# Patient Record
Sex: Female | Born: 1980 | Hispanic: Yes | Marital: Single | Smoking: Current every day smoker
Health system: Southern US, Community
[De-identification: ages and names within clinical notes are randomized; demographics above are authoritative.]

## PROBLEM LIST (undated history)

## (undated) DIAGNOSIS — F419 Anxiety disorder, unspecified: Secondary | ICD-10-CM

---

## 2016-03-20 ENCOUNTER — Emergency Department (HOSPITAL_BASED_OUTPATIENT_CLINIC_OR_DEPARTMENT_OTHER)
Admission: EM | Admit: 2016-03-20 | Discharge: 2016-03-20 | Disposition: A | Discharge status: Home / Self Care | Payer: BLUE CROSS/BLUE SHIELD | Attending: Emergency Medicine | Admitting: Emergency Medicine

## 2016-03-20 ENCOUNTER — Encounter (HOSPITAL_BASED_OUTPATIENT_CLINIC_OR_DEPARTMENT_OTHER): Payer: Self-pay

## 2016-03-20 ENCOUNTER — Emergency Department (HOSPITAL_BASED_OUTPATIENT_CLINIC_OR_DEPARTMENT_OTHER): Payer: BLUE CROSS/BLUE SHIELD

## 2016-03-20 DIAGNOSIS — R072 Precordial pain: Secondary | ICD-10-CM | POA: Diagnosis present

## 2016-03-20 DIAGNOSIS — K219 Gastro-esophageal reflux disease without esophagitis: Secondary | ICD-10-CM | POA: Insufficient documentation

## 2016-03-20 DIAGNOSIS — F17200 Nicotine dependence, unspecified, uncomplicated: Secondary | ICD-10-CM | POA: Insufficient documentation

## 2016-03-20 DIAGNOSIS — R079 Chest pain, unspecified: Secondary | ICD-10-CM

## 2016-03-20 LAB — CBC WITH DIFFERENTIAL/PLATELET
Basophils Absolute: 0 10*3/uL (ref 0.0–0.1)
Basophils Relative: 0 %
EOS PCT: 2 %
Eosinophils Absolute: 0.1 10*3/uL (ref 0.0–0.7)
HCT: 37 % (ref 36.0–46.0)
Hemoglobin: 12.3 g/dL (ref 12.0–15.0)
LYMPHS ABS: 1 10*3/uL (ref 0.7–4.0)
LYMPHS PCT: 16 %
MCH: 28 pg (ref 26.0–34.0)
MCHC: 33.2 g/dL (ref 30.0–36.0)
MCV: 84.1 fL (ref 78.0–100.0)
MONO ABS: 0.6 10*3/uL (ref 0.1–1.0)
MONOS PCT: 9 %
Neutro Abs: 4.7 10*3/uL (ref 1.7–7.7)
Neutrophils Relative %: 73 %
PLATELETS: 229 10*3/uL (ref 150–400)
RBC: 4.4 MIL/uL (ref 3.87–5.11)
RDW: 14.1 % (ref 11.5–15.5)
WBC: 6.3 10*3/uL (ref 4.0–10.5)

## 2016-03-20 LAB — BASIC METABOLIC PANEL
Anion gap: 6 (ref 5–15)
BUN: 10 mg/dL (ref 6–20)
CHLORIDE: 107 mmol/L (ref 101–111)
CO2: 24 mmol/L (ref 22–32)
CREATININE: 0.54 mg/dL (ref 0.44–1.00)
Calcium: 9 mg/dL (ref 8.9–10.3)
GFR calc Af Amer: >60 mL/min (ref >60)
GFR calc non Af Amer: >60 mL/min (ref >60)
GLUCOSE: 98 mg/dL (ref 65–99)
POTASSIUM: 4.2 mmol/L (ref 3.5–5.1)
SODIUM: 137 mmol/L (ref 135–145)

## 2016-03-20 LAB — TROPONIN I

## 2016-03-20 MED ORDER — GI COCKTAIL ~~LOC~~
30.0000 mL | Freq: Once | ORAL | Status: AC
  Administered 2016-03-20: 30 mL via ORAL
  Filled 2016-03-20: qty 30

## 2016-03-20 MED ORDER — OMEPRAZOLE 20 MG PO CPDR: 20.0000 mg | DELAYED_RELEASE_CAPSULE | Freq: Every day | ORAL | Status: AC

## 2016-03-20 MED FILL — OMEPRAZOLE DR 20 MG CAPSULE: 20 | 30 days supply | Qty: 30 | Fill #0

## 2016-03-20 NOTE — Discharge Instructions (Signed)
Gastroesophageal Reflux Disease, Adult °Normally, food travels down the esophagus and stays in the stomach to be digested. However, when a person has gastroesophageal reflux disease (GERD), food and stomach acid move back up into the esophagus. When this happens, the esophagus becomes sore and inflamed. Over time, GERD can create small holes (ulcers) in the lining of the esophagus.  °CAUSES °This condition is caused by a problem with the muscle between the esophagus and the stomach (lower esophageal sphincter, or LES). Normally, the LES muscle closes after food passes through the esophagus to the stomach. When the LES is weakened or abnormal, it does not close properly, and that allows food and stomach acid to go back up into the esophagus. The LES can be weakened by certain dietary substances, medicines, and medical conditions, including: °· Tobacco use. °· Pregnancy. °· Having a hiatal hernia. °· Heavy alcohol use. °· Certain foods and beverages, such as coffee, chocolate, onions, and peppermint. °RISK FACTORS °This condition is more likely to develop in: °· People who have an increased body weight. °· People who have connective tissue disorders. °· People who use NSAID medicines. °SYMPTOMS °Symptoms of this condition include: °· Heartburn. °· Difficult or painful swallowing. °· The feeling of having a lump in the throat. °· A bitter taste in the mouth. °· Bad breath. °· Having a large amount of saliva. °· Having an upset or bloated stomach. °· Belching. °· Chest pain. °· Shortness of breath or wheezing. °· Ongoing (chronic) cough or a night-time cough. °· Wearing away of tooth enamel. °· Weight loss. °Different conditions can cause chest pain. Make sure to see your health care provider if you experience chest pain. °DIAGNOSIS °Your health care provider will take a medical history and perform a physical exam. To determine if you have mild or severe GERD, your health care provider may also monitor how you respond  to treatment. You may also have other tests, including: °· An endoscopy to examine your stomach and esophagus with a small camera. °· A test that measures the acidity level in your esophagus. °· A test that measures how much pressure is on your esophagus. °· A barium swallow or modified barium swallow to show the shape, size, and functioning of your esophagus. °TREATMENT °The goal of treatment is to help relieve your symptoms and to prevent complications. Treatment for this condition may vary depending on how severe your symptoms are. Your health care provider may recommend: °· Changes to your diet. °· Medicine. °· Surgery. °HOME CARE INSTRUCTIONS °Diet °· Follow a diet as recommended by your health care provider. This may involve avoiding foods and drinks such as: °¨ Coffee and tea (with or without caffeine). °¨ Drinks that contain alcohol. °¨ Energy drinks and sports drinks. °¨ Carbonated drinks or sodas. °¨ Chocolate and cocoa. °¨ Peppermint and mint flavorings. °¨ Garlic and onions. °¨ Horseradish. °¨ Spicy and acidic foods, including peppers, chili powder, curry powder, vinegar, hot sauces, and barbecue sauce. °¨ Citrus fruit juices and citrus fruits, such as oranges, lemons, and limes. °¨ Tomato-based foods, such as red sauce, chili, salsa, and pizza with red sauce. °¨ Fried and fatty foods, such as donuts, french fries, potato chips, and high-fat dressings. °¨ High-fat meats, such as hot dogs and fatty cuts of red and white meats, such as rib eye steak, sausage, ham, and bacon. °¨ High-fat dairy items, such as whole milk, butter, and cream cheese. °· Eat small, frequent meals instead of large meals. °· Avoid drinking large amounts of liquid with your   meals. °· Avoid eating meals during the 2-3 hours before bedtime. °· Avoid lying down right after you eat. °· Do not exercise right after you eat. ° General Instructions  °· Pay attention to any changes in your symptoms. °· Take over-the-counter and prescription  medicines only as told by your health care provider. Do not take aspirin, ibuprofen, or other NSAIDs unless your health care provider told you to do so. °· Do not use any tobacco products, including cigarettes, chewing tobacco, and e-cigarettes. If you need help quitting, ask your health care provider. °· Wear loose-fitting clothing. Do not wear anything tight around your waist that causes pressure on your abdomen. °· Raise (elevate) the head of your bed 6 inches (15cm). °· Try to reduce your stress, such as with yoga or meditation. If you need help reducing stress, ask your health care provider. °· If you are overweight, reduce your weight to an amount that is healthy for you. Ask your health care provider for guidance about a safe weight loss goal. °· Keep all follow-up visits as told by your health care provider. This is important. °SEEK MEDICAL CARE IF: °· You have new symptoms. °· You have unexplained weight loss. °· You have difficulty swallowing, or it hurts to swallow. °· You have wheezing or a persistent cough. °· Your symptoms do not improve with treatment. °· You have a hoarse voice. °SEEK IMMEDIATE MEDICAL CARE IF: °· You have pain in your arms, neck, jaw, teeth, or back. °· You feel sweaty, dizzy, or light-headed. °· You have chest pain or shortness of breath. °· You vomit and your vomit looks like blood or coffee grounds. °· You faint. °· Your stool is bloody or black. °· You cannot swallow, drink, or eat. °  °This information is not intended to replace advice given to you by your health care provider. Make sure you discuss any questions you have with your health care provider. °  °Document Released: 08/15/2005 Document Revised: 07/27/2015 Document Reviewed: 03/02/2015 °Elsevier Interactive Patient Education ©2016 [employer] °Nonspecific Chest Pain  °Chest pain can be caused by many different conditions. There is always a chance that your pain could be related to something serious, such as a heart  attack or a blood clot in your lungs. Chest pain can also be caused by conditions that are not life-threatening. If you have chest pain, it is very important to follow up with your health care provider. °CAUSES  °Chest pain can be caused by: °· Heartburn. °· Pneumonia or bronchitis. °· Anxiety or stress. °· Inflammation around your heart (pericarditis) or lung (pleuritis or pleurisy). °· A blood clot in your lung. °· A collapsed lung (pneumothorax). It can develop suddenly on its own (spontaneous pneumothorax) or from trauma to the chest. °· Shingles infection (varicella-zoster virus). °· Heart attack. °· Damage to the bones, muscles, and cartilage that make up your chest wall. This can include: °· Bruised bones due to injury. °· Strained muscles or cartilage due to frequent or repeated coughing or overwork. °· Fracture to one or more ribs. °· Sore cartilage due to inflammation (costochondritis). °RISK FACTORS  °Risk factors for chest pain may include: °· Activities that increase your risk for trauma or injury to your chest. °· Respiratory infections or conditions that cause frequent coughing. °· Medical conditions or overeating that can cause heartburn. °· Heart disease or family history of heart disease. °· Conditions or health behaviors that increase your risk of developing a blood clot. °· Having had chicken pox (  varicella zoster). °SIGNS AND SYMPTOMS °Chest pain can feel like: °· Burning or tingling on the surface of your chest or deep in your chest. °· Crushing, pressure, aching, or squeezing pain. °· Dull or sharp pain that is worse when you move, cough, or take a deep breath. °· Pain that is also felt in your back, neck, shoulder, or arm, or pain that spreads to any of these areas. °Your chest pain may come and go, or it may stay constant. °DIAGNOSIS °Lab tests or other studies may be needed to find the cause of your pain. Your health care provider may have you take a test called an ambulatory ECG  (electrocardiogram). An ECG records your heartbeat patterns at the time the test is performed. You may also have other tests, such as: °· Transthoracic echocardiogram (TTE). During echocardiography, sound waves are used to create a picture of all of the heart structures and to look at how blood flows through your heart. °· Transesophageal echocardiogram (TEE). This is a more advanced imaging test that obtains images from inside your body. It allows your health care provider to see your heart in finer detail. °· Cardiac monitoring. This allows your health care provider to monitor your heart rate and rhythm in real time. °· Holter monitor. This is a portable device that records your heartbeat and can help to diagnose abnormal heartbeats. It allows your health care provider to track your heart activity for several days, if needed. °· Stress tests. These can be done through exercise or by taking medicine that makes your heart beat more quickly. °· Blood tests. °· Imaging tests. °TREATMENT  °Your treatment depends on what is causing your chest pain. Treatment may include: °· Medicines. These may include: °· Acid blockers for heartburn. °· Anti-inflammatory medicine. °· Pain medicine for inflammatory conditions. °· Antibiotic medicine, if an infection is present. °· Medicines to dissolve blood clots. °· Medicines to treat coronary artery disease. °· Supportive care for conditions that do not require medicines. This may include: °· Resting. °· Applying heat or cold packs to injured areas. °· Limiting activities until pain decreases. °HOME CARE INSTRUCTIONS °· If you were prescribed an antibiotic medicine, finish it all even if you start to feel better. °· Avoid any activities that bring on chest pain. °· Do not use any tobacco products, including cigarettes, chewing tobacco, or electronic cigarettes. If you need help quitting, ask your health care provider. °· Do not drink alcohol. °· Take medicines only as directed by  your health care provider. °· Keep all follow-up visits as directed by your health care provider. This is important. This includes any further testing if your chest pain does not go away. °· If heartburn is the cause for your chest pain, you may be told to keep your head raised (elevated) while sleeping. This reduces the chance that acid will go from your stomach into your esophagus. °· Make lifestyle changes as directed by your health care provider. These may include: °¨ Getting regular exercise. Ask your health care provider to suggest some activities that are safe for you. °¨ Eating a heart-healthy diet. A registered dietitian can help you to learn healthy eating options. °¨ Maintaining a healthy weight. °¨ Managing diabetes, if necessary. °¨ Reducing stress. °SEEK MEDICAL CARE IF: °· Your chest pain does not go away after treatment. °· You have a rash with blisters on your chest. °· You have a fever. °SEEK IMMEDIATE MEDICAL CARE IF:  °· Your chest pain is worse. °· You   have an increasing cough, or you cough up blood. °· You have severe abdominal pain. °· You have severe weakness. °· You faint. °· You have chills. °· You have sudden, unexplained chest discomfort. °· You have sudden, unexplained discomfort in your arms, back, neck, or jaw. °· You have shortness of breath at any time. °· You suddenly start to sweat, or your skin gets clammy. °· You feel nauseous or you vomit. °· You suddenly feel light-headed or dizzy. °· Your heart begins to beat quickly, or it feels like it is skipping beats. °These symptoms may represent a serious problem that is an emergency. Do not wait to see if the symptoms will go away. Get medical help right away. Call your local emergency services (911 in the [place] Do not drive yourself to the hospital. °  °This information is not intended to replace advice given to you by your health care provider. Make sure you discuss any questions you have with your health care provider. °  °Document  Released: 08/15/2005 Document Revised: 11/26/2014 Document Reviewed: 06/11/2014 °Elsevier Interactive Patient Education ©2016 [employer] ° °

## 2016-03-20 NOTE — ED Provider Notes (Signed)
CSN: [id number]     Arrival date & time 03/20/16  1215 History   First MD Initiated Contact with Patient 03/20/16 1247     Chief Complaint  Patient presents with  . Chest Pain    Brittany Lin is a 35 y.o. female who presents to the ED complaining of substernal nonradiating chest pain since yesterday around 6:30 in the morning yesterday. She currently complains of 5 out of 10 substernal and nonradiating chest pain she describes feeling as a bruise. She reports her pain is worse with touching. She denies any coughing or shortness of breath. She reports her pain is constant and she is unable to identify alleviating factors. She reports her pain was worse yesterday and is somewhat better today. She reports taking aspirin and ibuprofen yesterday with some relief. She does report some belching some symptoms of acid reflux. She is a smoker. No personal or close family history of MI, PE or DVT. She denies fevers, cough, shortness of breath, wheezing, leg pain, leg swelling, recent long travel, endogenous estrogen use, history of cancer, abdominal pain, nausea, vomiting, diarrhea, or rashes.  Patient is a 35 y.o. female presenting with chest pain. The history is provided by the patient. No language interpreter was used.  Chest Pain Associated symptoms: no abdominal pain, no back pain, no cough, no fever, no headache, no nausea, no numbness, no palpitations, no shortness of breath, not vomiting and no weakness     History reviewed. No pertinent past medical history. History reviewed. No pertinent past surgical history. No family history on file. Social History  Substance Use Topics  . Smoking status: Current Every Day Smoker  . Smokeless tobacco: None  . Alcohol Use: Yes     Comment: occ   OB History    No data available     Review of Systems  Constitutional: Negative for fever and chills.  HENT: Negative for congestion and sore throat.   Eyes: Negative for visual disturbance.  Respiratory:  Negative for cough, chest tightness, shortness of breath and wheezing.   Cardiovascular: Positive for chest pain. Negative for palpitations and leg swelling.  Gastrointestinal: Negative for nausea, vomiting, abdominal pain and diarrhea.  Genitourinary: Negative for dysuria and difficulty urinating.  Musculoskeletal: Negative for back pain and neck pain.  Skin: Negative for rash.  Neurological: Negative for weakness, numbness and headaches.      Allergies  Review of patient's allergies indicates not on file.  Home Medications   Prior to Admission medications   Medication Sig Start Date End Date Taking? Authorizing Provider  omeprazole (PRILOSEC) 20 MG capsule Take 1 capsule (20 mg total) by mouth daily. 03/20/16   Everlene Farrier, PA-C   BP 110/74 mmHg  Pulse 69  Temp(Src) 98.3 F (36.8 C) (Oral)  Resp 17  Ht 5\' 3"  (1.6 m)  Wt 62.596 kg  BMI 24.45 kg/m2  SpO2 99%  LMP 02/28/2016 Physical Exam  Constitutional: She appears well-developed and well-nourished. No distress.  Nontoxic appearing.  HENT:  Head: Normocephalic and atraumatic.  Mouth/Throat: Oropharynx is clear and moist.  Eyes: Conjunctivae are normal. Pupils are equal, round, and reactive to light. Right eye exhibits no discharge. Left eye exhibits no discharge.  Neck: Normal range of motion. Neck supple. No JVD present.  Cardiovascular: Normal rate, regular rhythm, normal heart sounds and intact distal pulses.  Exam reveals no gallop and no friction rub.   No murmur heard. Bilateral radial, posterior tibialis and dorsalis pedis pulses are intact.  Pulmonary/Chest: Effort normal and breath sounds normal. No stridor. No respiratory distress. She has no wheezes. She has no rales. She exhibits tenderness.  Lungs are clear to auscultation bilaterally. Patient has pinpoint substernal chest wall tenderness to palpation which reproduces her chest pain. No ecchymosis or rashes overlying this area. Chest expansion is symmetric  bilaterally.  Abdominal: Soft. There is no tenderness. There is no guarding.  Musculoskeletal: She exhibits no edema or tenderness.  No lower extremity edema or tenderness.  Lymphadenopathy:    She has no cervical adenopathy.  Neurological: She is alert. Coordination normal.  Sensation is intact to her bilateral upper and lower extremities.  Skin: Skin is warm and dry. No rash noted. She is not diaphoretic. No erythema. No pallor.  Psychiatric: She has a normal mood and affect. Her behavior is normal.  Nursing note and vitals reviewed.   ED Course  Procedures (including critical care time) Labs Review Labs Reviewed  BASIC METABOLIC PANEL  CBC WITH DIFFERENTIAL/PLATELET  TROPONIN I    Imaging Review Dg Chest 2 View  03/20/2016  CLINICAL DATA:  Chest pain starting yesterday, smoker EXAM: CHEST  2 VIEW COMPARISON:  None. FINDINGS: The heart size and mediastinal contours are within normal limits. Both lungs are clear. The visualized skeletal structures are unremarkable. IMPRESSION: No active cardiopulmonary disease. Electronically Signed   By: Natasha Mead M.D.   On: 03/20/2016 13:15   I have personally reviewed and evaluated these images and lab results as part of my medical decision-making.   EKG Interpretation   Date/Time:  Tuesday Mar 20 2016 12:28:23 EDT Ventricular Rate:  73 PR Interval:  161 QRS Duration: 91 QT Interval:  371 QTC Calculation: 409 R Axis:   53 Text Interpretation:  Sinus rhythm Baseline wander in lead(s) V3 No old  tracing to compare Confirmed by [facility]  MD, MARTHA [phone]) on 03/20/2016  12:32:55 PM      Filed Vitals:   03/20/16 1335 03/20/16 1345 03/20/16 1400 03/20/16 1415  BP: 118/74  110/74   Pulse: 66 64 64 69  Temp:      TempSrc:      Resp: Height:      Weight:      SpO2: 99% 100% 98% 99%     MDM   Meds given in ED:  Medications  gi cocktail (Maalox,Lidocaine,Donnatal) (30 mLs Oral Given 03/20/16 1336)    New Prescriptions    OMEPRAZOLE (PRILOSEC) 20 MG CAPSULE    Take 1 capsule (20 mg total) by mouth daily.    Final diagnoses:  Chest pain, unspecified chest pain type  Gastroesophageal reflux disease, esophagitis presence not specified   This is a 35 y.o. female who presents to the ED complaining of substernal nonradiating chest pain since yesterday around 6:30 in the morning yesterday. She currently complains of 5 out of 10 substernal and nonradiating chest pain she describes feeling as a bruise. She reports her pain is worse with touching. She denies any coughing or shortness of breath. She reports her pain is constant and she is unable to identify alleviating factors. She reports her pain was worse yesterday and is somewhat better today. She reports taking aspirin and ibuprofen yesterday with some relief. She does report some belching some symptoms of acid reflux. No SOB.  Patient presented with chest pain to the ED. Patient is to be discharged with recommendation to follow up with PCP in regards to today's hospital visit. Chest pain is not likely  of cardiac or pulmonary etiology due to presentation, perc negative, VSS, no tracheal deviation, no JVD or new murmur, RRR, breath sounds equal bilaterally, EKG without acute abnormalities, negative troponin, and negative CXR. No need for delta troponin as the patient's symptoms have been ongoing for more than 24 hours. Patient reports her pain completely resolved after GI cocktail in the emergency department. I suspect acid reflux. We'll discharge with prescription for omeprazole and close follow-up by primary care. Patient has been advised to return to the ED if chest pain becomes exertional, associated with diaphoresis or nausea, radiates to left jaw/arm, worsens or becomes concerning in any way. Patient appears reliable for follow up and is agreeable to discharge. I advised the patient to follow-up with their primary care provider this week. I advised the patient to return to  the emergency department with new or worsening symptoms or new concerns. The patient verbalized understanding and agreement with plan.      Everlene FarrierWilliam Kinberly Perris, PA-C 03/20/16 1423  Jerelyn ScottMartha Linker, MD 03/20/16 1425

## 2016-03-20 NOTE — ED Notes (Signed)
Patient transported to X-ray 

## 2016-03-20 NOTE — ED Notes (Signed)
CP started yesterday-NAD-steady gait 

## 2017-05-21 ENCOUNTER — Emergency Department (HOSPITAL_BASED_OUTPATIENT_CLINIC_OR_DEPARTMENT_OTHER): Payer: BLUE CROSS/BLUE SHIELD

## 2017-05-21 ENCOUNTER — Encounter (HOSPITAL_BASED_OUTPATIENT_CLINIC_OR_DEPARTMENT_OTHER): Payer: Self-pay | Admitting: Emergency Medicine

## 2017-05-21 ENCOUNTER — Emergency Department (HOSPITAL_BASED_OUTPATIENT_CLINIC_OR_DEPARTMENT_OTHER)
Admission: EM | Admit: 2017-05-21 | Discharge: 2017-05-21 | Disposition: A | Discharge status: Home / Self Care | Payer: BLUE CROSS/BLUE SHIELD | Attending: Emergency Medicine | Admitting: Emergency Medicine

## 2017-05-21 DIAGNOSIS — N200 Calculus of kidney: Secondary | ICD-10-CM | POA: Diagnosis not present

## 2017-05-21 DIAGNOSIS — N3001 Acute cystitis with hematuria: Secondary | ICD-10-CM | POA: Insufficient documentation

## 2017-05-21 DIAGNOSIS — N390 Urinary tract infection, site not specified: Secondary | ICD-10-CM

## 2017-05-21 DIAGNOSIS — F17200 Nicotine dependence, unspecified, uncomplicated: Secondary | ICD-10-CM | POA: Diagnosis not present

## 2017-05-21 DIAGNOSIS — Z79899 Other long term (current) drug therapy: Secondary | ICD-10-CM | POA: Diagnosis not present

## 2017-05-21 DIAGNOSIS — R1012 Left upper quadrant pain: Secondary | ICD-10-CM | POA: Diagnosis present

## 2017-05-21 DIAGNOSIS — R319 Hematuria, unspecified: Secondary | ICD-10-CM

## 2017-05-21 HISTORY — DX: Anxiety disorder, unspecified: F41.9

## 2017-05-21 LAB — CBC WITH DIFFERENTIAL/PLATELET
Basophils Absolute: 0 10*3/uL (ref 0.0–0.1)
Basophils Relative: 0 %
EOS PCT: 1 %
Eosinophils Absolute: 0.1 10*3/uL (ref 0.0–0.7)
HEMATOCRIT: 38.5 % (ref 36.0–46.0)
Hemoglobin: 12.7 g/dL (ref 12.0–15.0)
LYMPHS ABS: 1.3 10*3/uL (ref 0.7–4.0)
LYMPHS PCT: 14 %
MCH: 28.1 pg (ref 26.0–34.0)
MCHC: 33 g/dL (ref 30.0–36.0)
MCV: 85.2 fL (ref 78.0–100.0)
MONO ABS: 0.8 10*3/uL (ref 0.1–1.0)
Monocytes Relative: 8 %
Neutro Abs: 7.2 10*3/uL (ref 1.7–7.7)
Neutrophils Relative %: 77 %
PLATELETS: 261 10*3/uL (ref 150–400)
RBC: 4.52 MIL/uL (ref 3.87–5.11)
RDW: 13.5 % (ref 11.5–15.5)
WBC: 9.4 10*3/uL (ref 4.0–10.5)

## 2017-05-21 LAB — URINALYSIS, ROUTINE W REFLEX MICROSCOPIC
Glucose, UA: NEGATIVE mg/dL
Ketones, ur: 15 mg/dL — AB
Nitrite: POSITIVE — AB
PROTEIN: 30 mg/dL — AB
Specific Gravity, Urine: 1.038 — ABNORMAL HIGH (ref 1.005–1.030)
pH: 5 (ref 5.0–8.0)

## 2017-05-21 LAB — COMPREHENSIVE METABOLIC PANEL
ALBUMIN: 4.4 g/dL (ref 3.5–5.0)
ALT: 11 U/L — ABNORMAL LOW (ref 14–54)
AST: 21 U/L (ref 15–41)
Alkaline Phosphatase: 69 U/L (ref 38–126)
Anion gap: 11 (ref 5–15)
BUN: 19 mg/dL (ref 6–20)
CHLORIDE: 102 mmol/L (ref 101–111)
CO2: 22 mmol/L (ref 22–32)
Calcium: 9.7 mg/dL (ref 8.9–10.3)
Creatinine, Ser: 0.69 mg/dL (ref 0.44–1.00)
GFR calc Af Amer: >60 mL/min (ref >60)
GFR calc non Af Amer: >60 mL/min (ref >60)
GLUCOSE: 117 mg/dL — AB (ref 65–99)
POTASSIUM: 3.7 mmol/L (ref 3.5–5.1)
SODIUM: 135 mmol/L (ref 135–145)
Total Bilirubin: 0.6 mg/dL (ref 0.3–1.2)
Total Protein: 7.7 g/dL (ref 6.5–8.1)

## 2017-05-21 LAB — URINALYSIS, MICROSCOPIC (REFLEX)

## 2017-05-21 LAB — PREGNANCY, URINE: PREG TEST UR: NEGATIVE

## 2017-05-21 MED ORDER — KETOROLAC TROMETHAMINE 30 MG/ML IJ SOLN
30.0000 mg | Freq: Once | INTRAMUSCULAR | Status: AC
  Administered 2017-05-21: 30 mg via INTRAVENOUS
  Filled 2017-05-21: qty 1

## 2017-05-21 MED ORDER — ONDANSETRON 4 MG PO TBDP: 4.0000 mg | ORAL_TABLET | Freq: Three times a day (TID) | ORAL | 0 refills | Status: AC | PRN

## 2017-05-21 MED ORDER — IBUPROFEN 400 MG PO TABS: 400.0000 mg | ORAL_TABLET | Freq: Four times a day (QID) | ORAL | 0 refills | Status: AC | PRN

## 2017-05-21 MED ORDER — OXYCODONE HCL 5 MG PO TABS
5.0000 mg | ORAL_TABLET | ORAL | 0 refills | Status: AC | PRN
Start: 2017-05-21 — End: ?

## 2017-05-21 MED ORDER — SODIUM CHLORIDE 0.9 % IV BOLUS (SEPSIS)
1000.0000 mL | Freq: Once | INTRAVENOUS | Status: AC
  Administered 2017-05-21: 1000 mL via INTRAVENOUS

## 2017-05-21 MED ORDER — ONDANSETRON HCL 4 MG/2ML IJ SOLN
4.0000 mg | Freq: Once | INTRAMUSCULAR | Status: AC
  Administered 2017-05-21: 4 mg via INTRAVENOUS
  Filled 2017-05-21: qty 2

## 2017-05-21 MED ORDER — CEPHALEXIN 500 MG PO CAPS: 500.0000 mg | ORAL_CAPSULE | Freq: Three times a day (TID) | ORAL | 0 refills | Status: AC

## 2017-05-21 MED ORDER — TAMSULOSIN HCL 0.4 MG PO CAPS: 0.4000 mg | ORAL_CAPSULE | Freq: Every day | ORAL | 0 refills | Status: AC

## 2017-05-21 MED ORDER — MORPHINE SULFATE (PF) 4 MG/ML IV SOLN
4.0000 mg | Freq: Once | INTRAVENOUS | Status: AC
  Administered 2017-05-21: 4 mg via INTRAVENOUS
  Filled 2017-05-21: qty 1

## 2017-05-21 MED ORDER — ACETAMINOPHEN 500 MG PO TABS: 1000.0000 mg | ORAL_TABLET | Freq: Four times a day (QID) | ORAL | 0 refills | Status: AC | PRN

## 2017-05-21 NOTE — ED Triage Notes (Signed)
Patient states that on Saturday she had pressure in her bladder and thought she was getting a UTI - patient then reports that she started to have pain in her left flank 2 days ago off and on. Today she noticed blood in her urine and the pain is worse. Patient reports nausea, patient is rocking back and forth in triage

## 2017-05-21 NOTE — ED Provider Notes (Signed)
[facility] Provider Note   CSN: [id number] Arrival date & time: 05/21/17  1625     History   Chief Complaint Chief Complaint  Patient presents with  . Flank Pain    HPI Brittany Lin is a 36 y.o. female.  HPI   Presents with concern for left-sided flank pain, pulling sensation in by the bladder. Reports that the pain is in the left upper quadrant, left flank and began on Sunday, however has been worsening. Reports that it feels similar to prior kidney stones. Describes the pain as "like contractions" and rated 10 out of 10. Has tried using Tylenol and Azo without relief. Initially thought it was a urinary tract infection, however at this point it feels like kidney stones. Denies fevers, vomiting. Reports she's had some diarrhea and some nausea. Reports prior kidney stones, however has not had any procedures for kidney stone removal in the past.  Past Medical History:  Diagnosis Date  . Anxiety     There are no active problems to display for this patient.   History reviewed. No pertinent surgical history.  OB History    No data available       Home Medications    Prior to Admission medications   Medication Sig Start Date End Date Taking? Authorizing Provider  escitalopram (LEXAPRO) 10 MG tablet Take 10 mg by mouth daily.   Yes [provider]  acetaminophen (TYLENOL) 500 MG tablet Take 2 tablets (1,000 mg total) by mouth every 6 (six) hours as needed. Do not exceed more than 4000mg  of tylenol from all sources in one day. 05/21/17   Alvira MondaySchlossman, Delancey Moraes, MD  cephALEXin (KEFLEX) 500 MG capsule Take 1 capsule (500 mg total) by mouth 3 (three) times daily. 05/21/17 05/31/17  Alvira MondaySchlossman, Lynnox Girten, MD  ibuprofen (ADVIL,MOTRIN) 400 MG tablet Take 1 tablet (400 mg total) by mouth every 6 (six) hours as needed. 05/21/17   Alvira MondaySchlossman, Zaden Sako, MD  omeprazole (PRILOSEC) 20 MG capsule Take 1 capsule (20 mg total) by mouth daily. 03/20/16   Everlene Farrieransie, William, PA-C  ondansetron  (ZOFRAN ODT) 4 MG disintegrating tablet Take 1 tablet (4 mg total) by mouth every 8 (eight) hours as needed for nausea or vomiting. 05/21/17   Alvira MondaySchlossman, Samiah Ricklefs, MD  oxyCODONE (ROXICODONE) 5 MG immediate release tablet Take 1 tablet (5 mg total) by mouth every 4 (four) hours as needed for severe pain. 05/21/17   Alvira MondaySchlossman, Sarai January, MD  tamsulosin (FLOMAX) 0.4 MG CAPS capsule Take 1 capsule (0.4 mg total) by mouth daily. 05/21/17 06/04/17  Alvira MondaySchlossman, Dimonique Bourdeau, MD    Family History History reviewed. No pertinent family history.  Social History Social History  Substance Use Topics  . Smoking status: Current Every Day Smoker  . Smokeless tobacco: Never Used  . Alcohol use Yes     Comment: occ     Allergies   Patient has no known allergies.   Review of Systems Review of Systems  Constitutional: Negative for fever.  HENT: Negative for sore throat.   Eyes: Negative for visual disturbance.  Respiratory: Negative for cough and shortness of breath.   Cardiovascular: Negative for chest pain.  Gastrointestinal: Positive for abdominal pain and nausea. Negative for constipation and vomiting.  Genitourinary: Positive for flank pain. Negative for difficulty urinating and dysuria.  Musculoskeletal: Negative for back pain and neck pain.  Skin: Negative for rash.  Neurological: Negative for syncope and headaches.     Physical Exam Updated Vital Signs BP 115/73 (BP Location: Left Arm)  Pulse 64   Temp 98.3 F (36.8 C) (Oral)   Resp 18   Ht 5\' 3"  (1.6 m)   Wt 60.8 kg (134 lb)   LMP 05/14/2017 Comment: neg preg test  SpO2 100%   BMI 23.74 kg/m   Physical Exam  Constitutional: She is oriented to person, place, and time. She appears well-developed and well-nourished. She appears ill (appears in pain). No distress.  HENT:  Head: Normocephalic and atraumatic.  Eyes: Conjunctivae and EOM are normal.  Neck: Normal range of motion.  Cardiovascular: Normal rate, regular rhythm, normal heart sounds and  intact distal pulses.  Exam reveals no gallop and no friction rub.   No murmur heard. Pulmonary/Chest: Effort normal and breath sounds normal. No respiratory distress. She has no wheezes. She has no rales.  Abdominal: Soft. She exhibits no distension. There is tenderness (LUQ, LLQ). There is no guarding.  Musculoskeletal: She exhibits no edema or tenderness.  Neurological: She is alert and oriented to person, place, and time.  Skin: Skin is warm and dry. No rash noted. She is not diaphoretic. No erythema.  Nursing note and vitals reviewed.    ED Treatments / Results  Labs (all labs ordered are listed, but only abnormal results are displayed) Labs Reviewed  URINALYSIS, ROUTINE W REFLEX MICROSCOPIC - Abnormal; Notable for the following:       Result Value   Color, Urine ORANGE (*)    APPearance CLOUDY (*)    Specific Gravity, Urine 1.038 (*)    Hgb urine dipstick LARGE (*)    Bilirubin Urine SMALL (*)    Ketones, ur 15 (*)    Protein, ur 30 (*)    Nitrite POSITIVE (*)    Leukocytes, UA SMALL (*)    All other components within normal limits  COMPREHENSIVE METABOLIC PANEL - Abnormal; Notable for the following:    Glucose, Bld 117 (*)    ALT 11 (*)    All other components within normal limits  URINALYSIS, MICROSCOPIC (REFLEX) - Abnormal; Notable for the following:    Bacteria, UA MANY (*)    Squamous Epithelial / LPF 0-5 (*)    All other components within normal limits  URINE CULTURE  PREGNANCY, URINE  CBC WITH DIFFERENTIAL/PLATELET    EKG  EKG Interpretation None       Radiology Ct Renal Stone Study  Result Date: 05/21/2017 CLINICAL DATA:  Bladder pressure, UTI.  Left flank pain. EXAM: CT ABDOMEN AND PELVIS WITHOUT CONTRAST TECHNIQUE: Multidetector CT imaging of the abdomen and pelvis was performed following the standard protocol without IV contrast. COMPARISON:  None. FINDINGS: Lower chest: Lung bases are clear. No effusions. Heart is normal size. Hepatobiliary: No focal  hepatic abnormality. Gallbladder unremarkable. Pancreas: No focal abnormality or ductal dilatation. Spleen: No focal abnormality.  Normal size. Adrenals/Urinary Tract: Mild left hydronephrosis and hydroureter due to 5 mm left UVJ stone. No hydronephrosis on the right. No renal stones bilaterally. Adrenal glands and urinary bladder unremarkable. Stomach/Bowel: Normal appendix. Stomach, large and small bowel grossly unremarkable. Vascular/Lymphatic: No evidence of aneurysm or adenopathy. Reproductive: Uterus and adnexa unremarkable.  No mass. Other: No free fluid or free air. Musculoskeletal: No acute bony abnormality. IMPRESSION: 5 mm left UVJ stone with mild left hydronephrosis. Electronically Signed   By: Charlett Nose M.D.   On: 05/21/2017 17:56    Procedures Procedures (including critical care time)  Medications Ordered in ED Medications  sodium chloride 0.9 % bolus 1,000 mL (0 mLs Intravenous Stopped 05/21/17 1837)  ketorolac (TORADOL) 30 MG/ML injection 30 mg (30 mg Intravenous Given 05/21/17 1715)  ondansetron (ZOFRAN) injection 4 mg (4 mg Intravenous Given 05/21/17 1716)  morphine 4 MG/ML injection 4 mg (4 mg Intravenous Given 05/21/17 1713)     Initial Impression / Assessment and Plan / ED Course  I have reviewed the triage vital signs and the nursing notes.  Pertinent labs & imaging results that were available during my care of the patient were reviewed by me and considered in my medical decision making (see chart for details).     36yo female with history of nephrolithiasis presents with concern for left flank pain. Urinalysis difficult to interpret given pt on azo, however shows many bacteria/nitrite pos. CT ordered given ?infection in addition to symptoms consistent with nephrolithiasis. CT stone study shows 5mm UVJ stone, mild hydronephrosis.  Pain improved with toradol/morphine.  No fever, no sign of sepsis, pt appropriate for outpatient management with strict return precautions  Given  rx for keflex, flomax, zofran, ibuprofen, tylenol and oxycodone. Discussed risks of narcotics. Recommend outpatient urology follow up, return for worsening. Patient discharged in stable condition with understanding of reasons to return.    Final Clinical Impressions(s) / ED Diagnoses   Final diagnoses:  Nephrolithiasis  Urinary tract infection with hematuria, site unspecified, possible UTI    New Prescriptions Discharge Medication List as of 05/21/2017  6:11 PM    START taking these medications   Details  acetaminophen (TYLENOL) 500 MG tablet Take 2 tablets (1,000 mg total) by mouth every 6 (six) hours as needed. Do not exceed more than 4000mg  of tylenol from all sources in one day., Starting Tue 05/21/2017, Print    cephALEXin (KEFLEX) 500 MG capsule Take 1 capsule (500 mg total) by mouth 3 (three) times daily., Starting Tue 05/21/2017, Until Fri 05/31/2017, Print    ibuprofen (ADVIL,MOTRIN) 400 MG tablet Take 1 tablet (400 mg total) by mouth every 6 (six) hours as needed., Starting Tue 05/21/2017, Print    ondansetron (ZOFRAN ODT) 4 MG disintegrating tablet Take 1 tablet (4 mg total) by mouth every 8 (eight) hours as needed for nausea or vomiting., Starting Tue 05/21/2017, Print    oxyCODONE (ROXICODONE) 5 MG immediate release tablet Take 1 tablet (5 mg total) by mouth every 4 (four) hours as needed for severe pain., Starting Tue 05/21/2017, Print    tamsulosin (FLOMAX) 0.4 MG CAPS capsule Take 1 capsule (0.4 mg total) by mouth daily., Starting Tue 05/21/2017, Until Tue 06/04/2017, Print         Alvira Monday, MD 05/22/17 [phone]

## 2017-05-23 LAB — URINE CULTURE

## 2018-08-11 IMAGING — CT CT RENAL STONE PROTOCOL
2 of 4 series · 16 of 46 positions shown, 18 images · non-contrast
Comparison: None.

CLINICAL DATA: Bladder pressure, UTI.  Left flank pain.

EXAM:
CT ABDOMEN AND PELVIS WITHOUT CONTRAST
TECHNIQUE: Multidetector CT imaging of the abdomen and pelvis was performed
following the standard protocol without IV contrast.

[Series 2: axial st · axial · 0.69mm/px · z∈[+555,+955]mm · 13 of 88 slices shown, 15 images]
[im 4/88  soft-tissue]
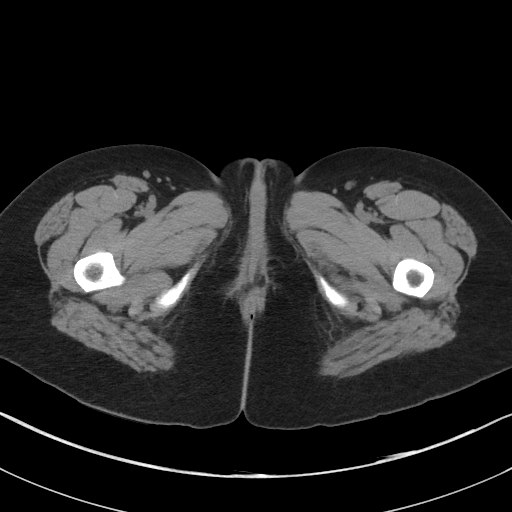
[im 4/88  bone]
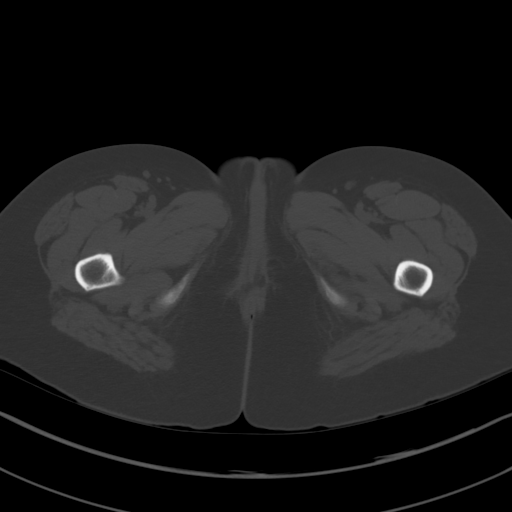
[im 11/88  soft-tissue]
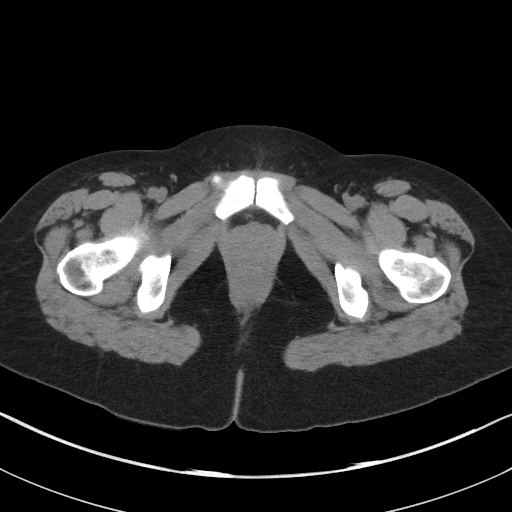
[im 19/88  soft-tissue]
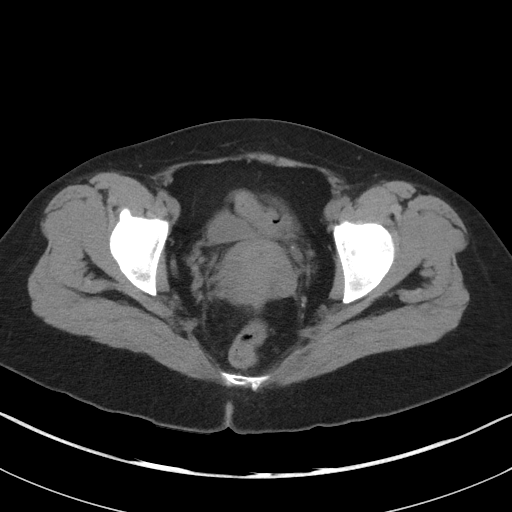
[im 26/88  soft-tissue]
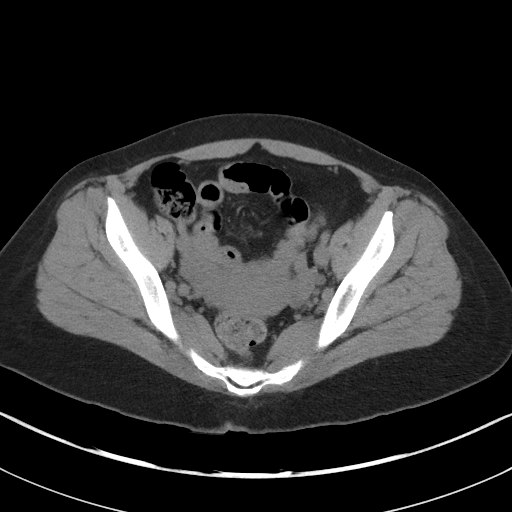
[im 30/88  soft-tissue]
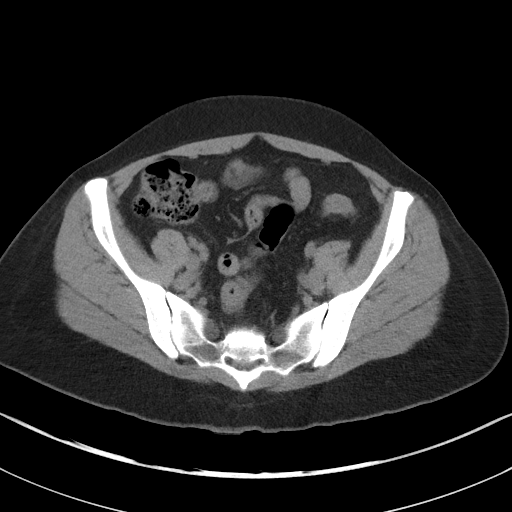
[im 37/88  soft-tissue]
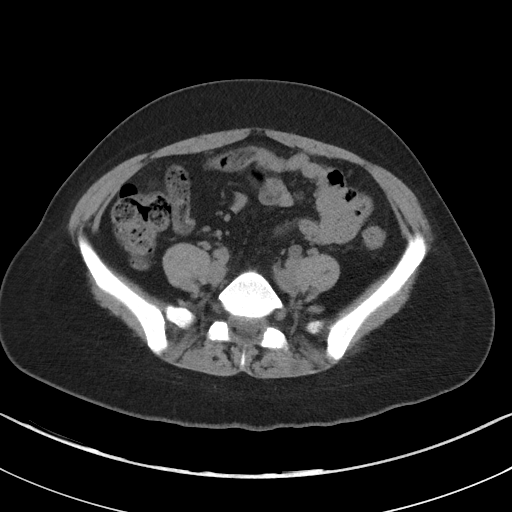
[im 44/88  soft-tissue]
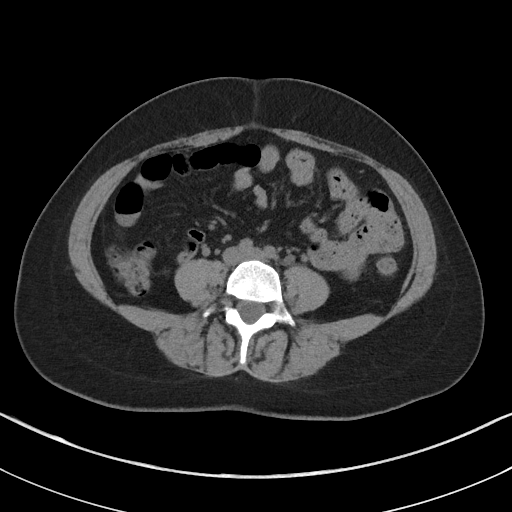
[im 51/88  soft-tissue]
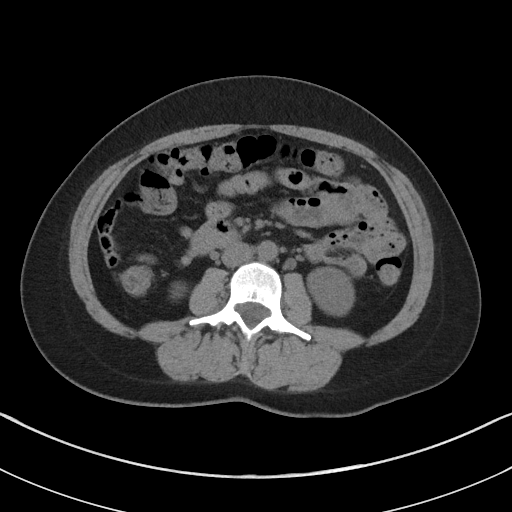
[im 59/88  soft-tissue]
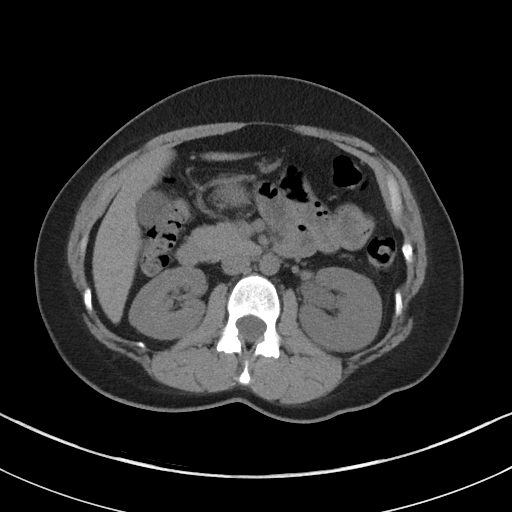
[im 59/88  bone]
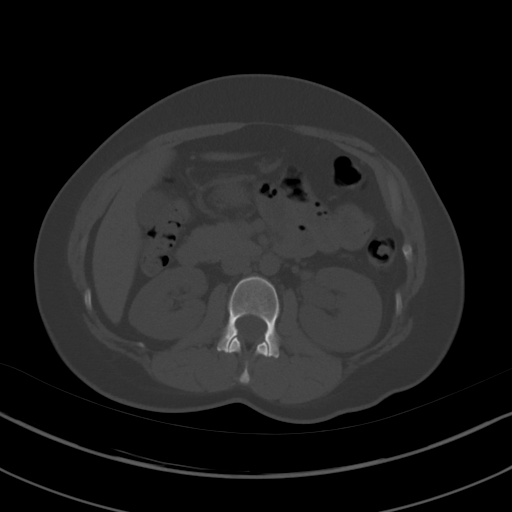
[im 62/88  soft-tissue]
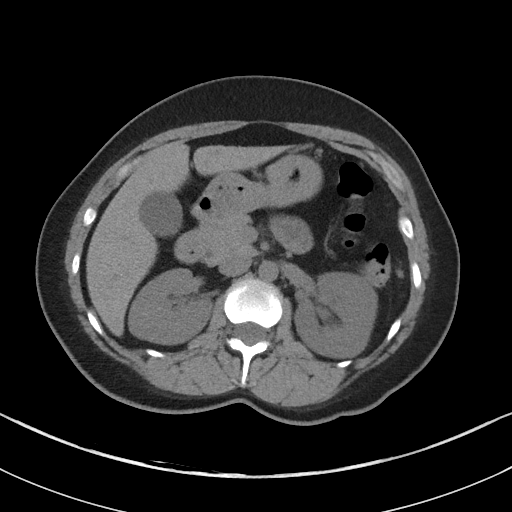
[im 69/88  soft-tissue]
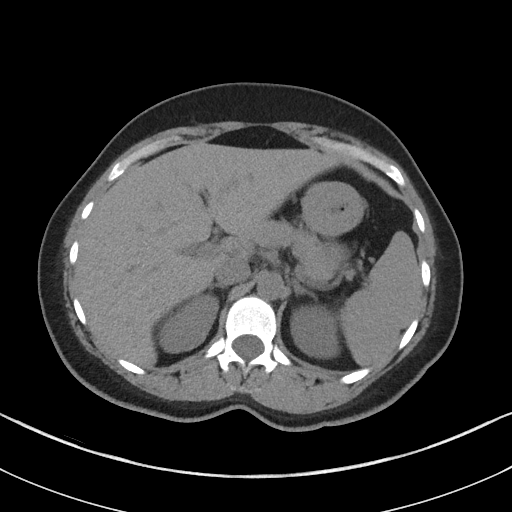
[im 77/88  soft-tissue]
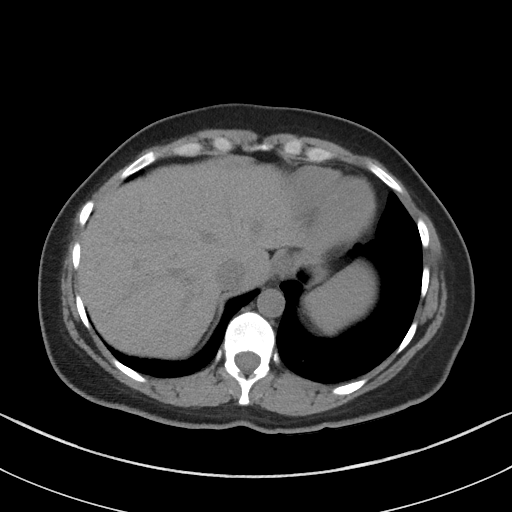
[im 84/88  soft-tissue]
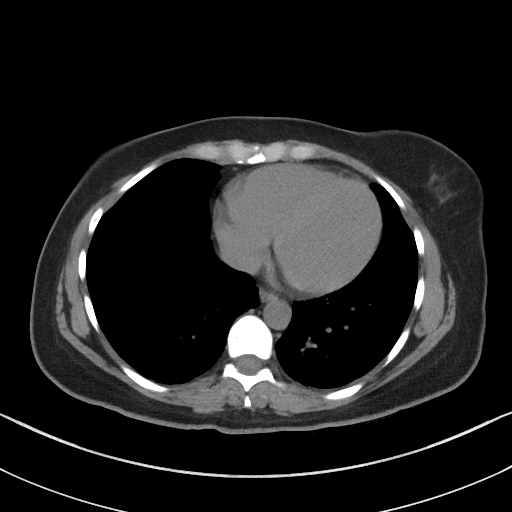

[Series 5: coronal st · coronal · 0.66mm/px · 3 of 79 slices shown]
[im 27/79  soft-tissue]
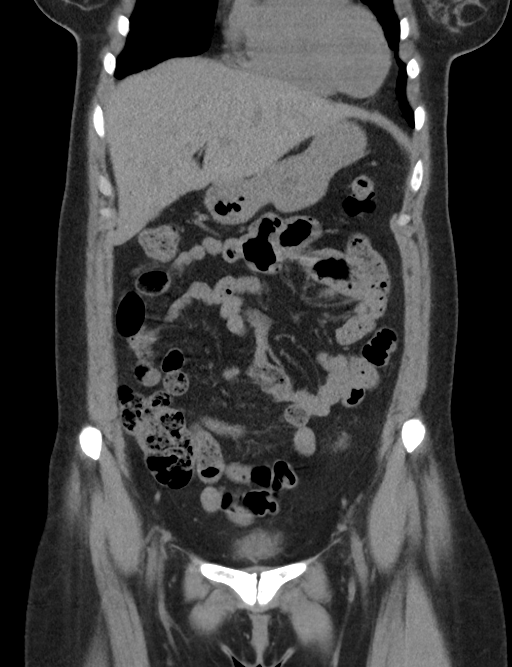
[im 35/79  soft-tissue]
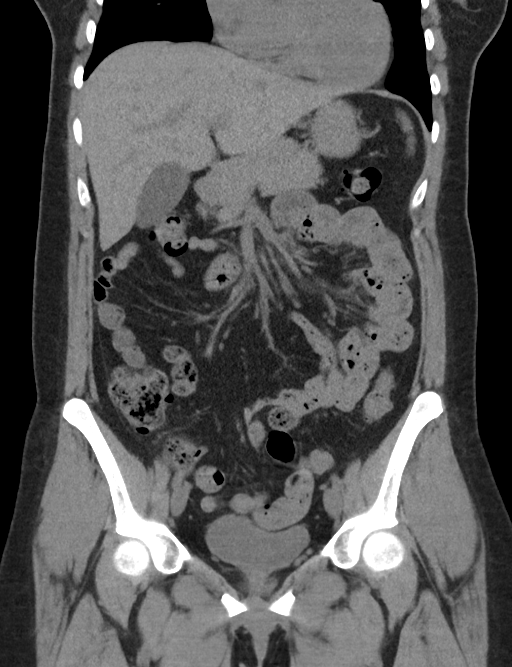
[im 44/79  soft-tissue]
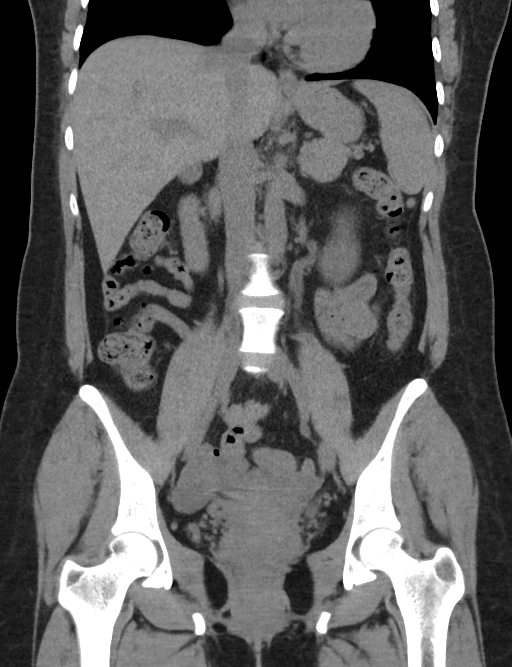

[16 of 46 positions shown; findings below may reference images not displayed]

FINDINGS: Lower chest: Lung bases are clear. No effusions. Heart is normal
size.

Hepatobiliary: No focal hepatic abnormality. Gallbladder
unremarkable.

Pancreas: No focal abnormality or ductal dilatation.

Spleen: No focal abnormality.  Normal size.

Adrenals/Urinary Tract: Mild left hydronephrosis and hydroureter due
to 5 mm left UVJ stone. No hydronephrosis on the right. No renal
stones bilaterally. Adrenal glands and urinary bladder unremarkable.

Stomach/Bowel: Normal appendix. Stomach, large and small bowel
grossly unremarkable.

Vascular/Lymphatic: No evidence of aneurysm or adenopathy.

Reproductive: Uterus and adnexa unremarkable.  No mass.

Other: No free fluid or free air.

Musculoskeletal: No acute bony abnormality.
IMPRESSION: 5 mm left UVJ stone with mild left hydronephrosis.

## 2021-09-20 ENCOUNTER — Emergency Department (HOSPITAL_BASED_OUTPATIENT_CLINIC_OR_DEPARTMENT_OTHER)
Admission: EM | Admit: 2021-09-20 | Discharge: 2021-09-20 | Discharge status: Against Medical Advice | Payer: BLUE CROSS/BLUE SHIELD

## 2021-09-20 ENCOUNTER — Other Ambulatory Visit: Payer: Self-pay
